# Patient Record
Sex: Male | Born: 1990 | Race: Asian | Hispanic: No | Marital: Single | Smoking: Never smoker
Health system: Southern US, Community
[De-identification: ages and names within clinical notes are randomized; demographics above are authoritative.]

## PROBLEM LIST (undated history)

## (undated) ENCOUNTER — Emergency Department (HOSPITAL_COMMUNITY): Admission: EM | Discharge status: Absent without leave | Payer: Self-pay

## (undated) DIAGNOSIS — Z9109 Other allergy status, other than to drugs and biological substances: Secondary | ICD-10-CM

---
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
## 2012-09-30 ENCOUNTER — Emergency Department (INDEPENDENT_AMBULATORY_CARE_PROVIDER_SITE_OTHER): Payer: Self-pay

## 2012-09-30 ENCOUNTER — Emergency Department (INDEPENDENT_AMBULATORY_CARE_PROVIDER_SITE_OTHER)
Admission: EM | Admit: 2012-09-30 | Discharge: 2012-09-30 | Disposition: A | Discharge status: Home / Self Care | Payer: Self-pay | Source: Home / Self Care | Attending: Emergency Medicine | Admitting: Emergency Medicine

## 2012-09-30 ENCOUNTER — Encounter (HOSPITAL_COMMUNITY): Payer: Self-pay | Admitting: *Deleted

## 2012-09-30 DIAGNOSIS — S139XXA Sprain of joints and ligaments of unspecified parts of neck, initial encounter: Secondary | ICD-10-CM

## 2012-09-30 DIAGNOSIS — R05 Cough: Secondary | ICD-10-CM

## 2012-09-30 DIAGNOSIS — R059 Cough, unspecified: Secondary | ICD-10-CM

## 2012-09-30 DIAGNOSIS — S161XXA Strain of muscle, fascia and tendon at neck level, initial encounter: Secondary | ICD-10-CM

## 2012-09-30 HISTORY — DX: Other allergy status, other than to drugs and biological substances: Z91.09

## 2012-09-30 MED ORDER — CYCLOBENZAPRINE HCL 10 MG PO TABS: 10.0000 mg | ORAL_TABLET | Freq: Three times a day (TID) | ORAL | Status: DC | PRN

## 2012-09-30 MED ORDER — HYDROCODONE-ACETAMINOPHEN 5-325 MG PO TABS: 1.0000 | ORAL_TABLET | ORAL | Status: DC | PRN
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
## 2012-10-02 NOTE — ED Provider Notes (Signed)
Medical screening examination/treatment/procedure(s) were performed by non-physician practitioner and as supervising physician I was immediately available for consultation/collaboration.  Diamone Whistler   Matisse Roskelley, MD 10/02/12 1156 

## 2014-09-06 IMAGING — CR DG CHEST 2V
2 series · 2 of 2 positions shown · non-contrast
Comparison: None.

CLINICAL DATA: 21-year-old male with productive cough.  History of
bronchitis.

CHEST - 2 VIEW

[view not recorded (1 of 2)]
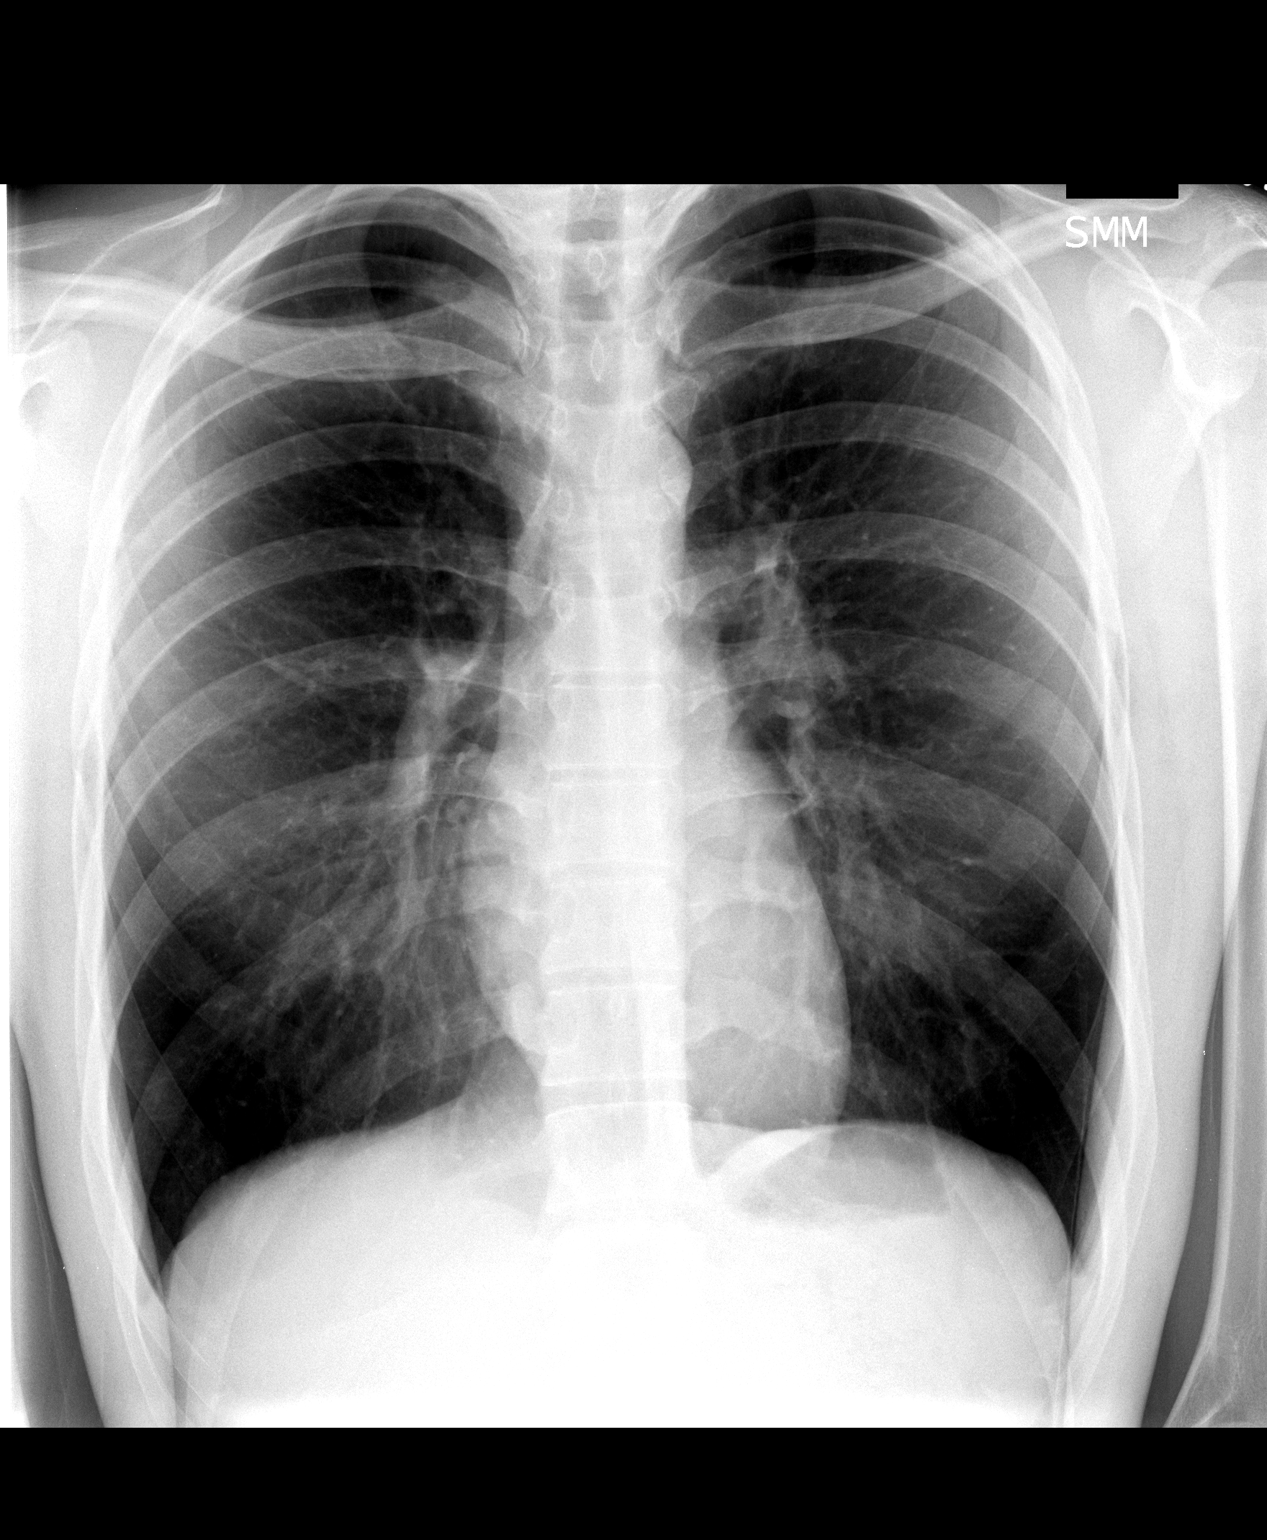

[view not recorded (2 of 2)]
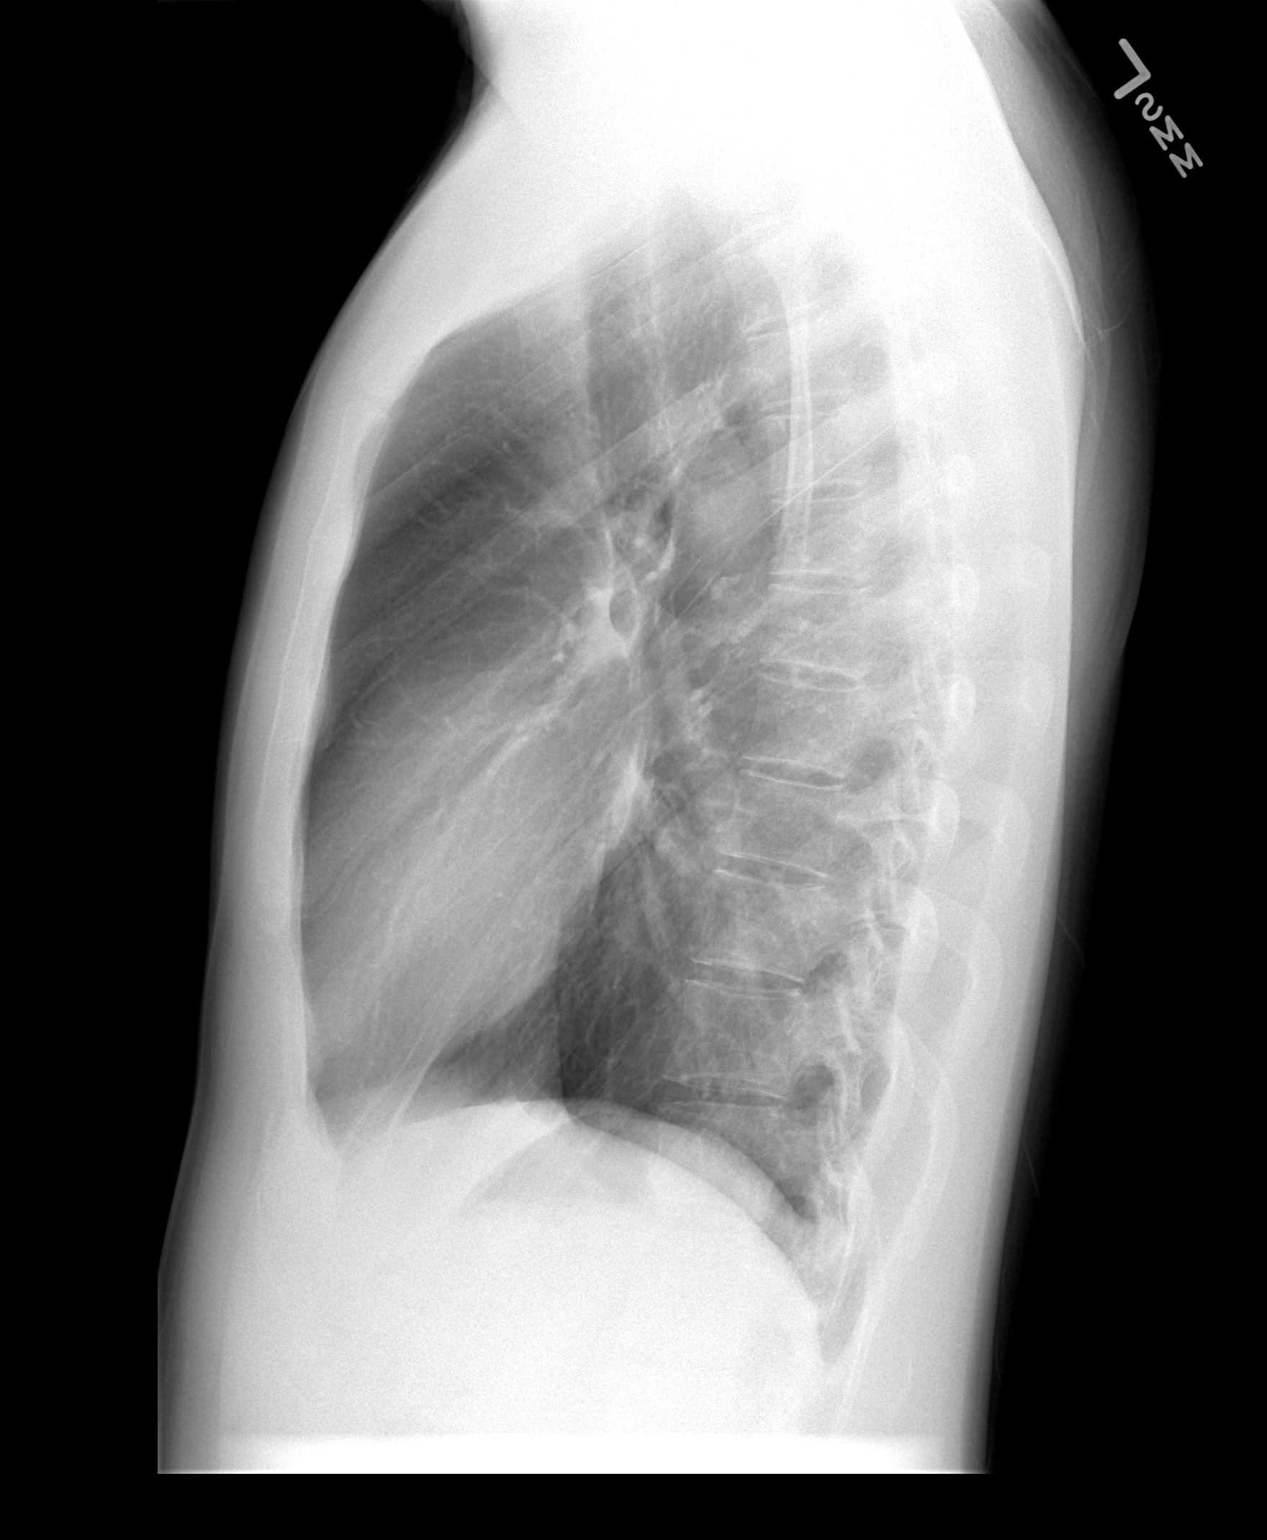

[2 of 2 positions shown; findings below may reference images not displayed]

FINDINGS: Normal lung volumes. Normal cardiac size and mediastinal
contours.  Negative tracheal air column.  The lungs are clear.  No
pneumothorax, pulmonary edema, pleural effusion or confluent
pulmonary opacity.  No pulmonary granuloma or scarring identified.
No osseous abnormality identified.
IMPRESSION: Negative, no acute cardiopulmonary abnormality.

## 2018-05-20 ENCOUNTER — Other Ambulatory Visit: Payer: Self-pay

## 2018-05-20 ENCOUNTER — Encounter (HOSPITAL_COMMUNITY): Payer: Self-pay | Admitting: Emergency Medicine

## 2018-05-20 ENCOUNTER — Emergency Department (HOSPITAL_COMMUNITY): Payer: Self-pay

## 2018-05-20 ENCOUNTER — Emergency Department (HOSPITAL_COMMUNITY)
Admission: EM | Admit: 2018-05-20 | Discharge: 2018-05-20 | Disposition: A | Discharge status: Home / Self Care | Payer: Self-pay | Attending: Emergency Medicine | Admitting: Emergency Medicine

## 2018-05-20 DIAGNOSIS — S80211A Abrasion, right knee, initial encounter: Secondary | ICD-10-CM | POA: Insufficient documentation

## 2018-05-20 DIAGNOSIS — Y9289 Other specified places as the place of occurrence of the external cause: Secondary | ICD-10-CM | POA: Insufficient documentation

## 2018-05-20 DIAGNOSIS — S0083XA Contusion of other part of head, initial encounter: Secondary | ICD-10-CM | POA: Insufficient documentation

## 2018-05-20 DIAGNOSIS — Y9389 Activity, other specified: Secondary | ICD-10-CM | POA: Insufficient documentation

## 2018-05-20 DIAGNOSIS — S0003XA Contusion of scalp, initial encounter: Secondary | ICD-10-CM

## 2018-05-20 DIAGNOSIS — Y999 Unspecified external cause status: Secondary | ICD-10-CM | POA: Insufficient documentation

## 2018-05-20 DIAGNOSIS — R51 Headache: Secondary | ICD-10-CM | POA: Insufficient documentation

## 2018-05-20 MED ORDER — ACETAMINOPHEN 500 MG PO TABS
1000.0000 mg | ORAL_TABLET | Freq: Once | ORAL | Status: AC
  Administered 2018-05-20: 1000 mg via ORAL
  Filled 2018-05-20: qty 2
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
## 2018-05-20 NOTE — ED Provider Notes (Signed)
Received sign out at shift change from PA [employer] Browning. CT head shows scalp contusion, negative for intracranial abnormality.  Patient will be discharged with instructions to follow-up with the concussion clinic.  I discussed NSAIDs for pain.  We talked about reducing screen time and no contact sports until cleared by physician.  Discussed return precautions and he agrees.   Patrick ShropshireShrosbree, Patrick Ferber J, PA-C 05/20/18 [id number]    Nicanor AlconPalumbo, April, MD 05/20/18 2326
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
## 2018-05-20 NOTE — Discharge Instructions (Addendum)
The scan of your head was reassuring.   Please follow up with the concussion clinic. Take motrin 600mg  every six hours as needed for headache. Avoid screen time like we discussed.   Return to the ER for any new or worsening symptoms like worsening headache with vomiting, trouble with your vision, numbness, weakness.

## 2018-05-20 NOTE — ED Notes (Signed)
Bed: WA15 Expected date:  Expected time:  Means of arrival:  Comments: 

## 2018-05-22 ENCOUNTER — Telehealth: Payer: Self-pay
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
## 2018-06-04 ENCOUNTER — Telehealth: Payer: Self-pay
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
## 2018-06-07 ENCOUNTER — Ambulatory Visit (INDEPENDENT_AMBULATORY_CARE_PROVIDER_SITE_OTHER): Payer: Self-pay | Admitting: Family Medicine

## 2018-06-07 ENCOUNTER — Encounter: Payer: Self-pay | Admitting: Family Medicine

## 2018-06-07 DIAGNOSIS — S060XAA Concussion with loss of consciousness status unknown, initial encounter: Secondary | ICD-10-CM | POA: Insufficient documentation

## 2018-06-07 DIAGNOSIS — S060X9A Concussion with loss of consciousness of unspecified duration, initial encounter: Secondary | ICD-10-CM | POA: Insufficient documentation

## 2018-06-07 DIAGNOSIS — S060X1A Concussion with loss of consciousness of 30 minutes or less, initial encounter: Secondary | ICD-10-CM

## 2018-06-07 NOTE — Patient Instructions (Signed)
Good to see you  I think you do have a concussion still  Fish oil 2 grams daily  Vitamin D 2000 IU daily  Choline 500mg  daily  New exercises can be helpful  See me again in 3 weeks if not perfect

## 2018-06-07 NOTE — Assessment & Plan Note (Signed)
Patient does still have what appears to be a concussion.  3 weeks out.  Vestibular neuro difficulty seems to be going on.  Discussed icing regimen and home exercises.  Discussed which activities to do which wants to avoid.  Given vestibular neuro training.  Discussed vitamin supplementations.  Patient will follow-up with me again in 3 weeks if not completely resolved.  Patient needs to continue to work financially.

## 2018-06-29 NOTE — Progress Notes (Deleted)
Subjective:   @Patrick Snow @  Chief Complaint: De NurseManh C Ivy, DOB: 04/01/1991, is a 27 y.o. male who presents for No chief complaint on file.   Injury date : *** Visit #: ***  Previous imagine.   History of Present Illness:    Concussion Self-Reported Symptom Score Symptoms rated on a scale 1-6, in last 24 hours  Headache: ***    Nausea: ***  Vomiting: ***  Balance Difficulty: ***   Dizziness: ***  Fatigue: ***  Trouble Falling Asleep: ***   Sleep More Than Usual: ***  Sleep Less Than Usual: ***  Daytime Drowsiness: ***  Photophobia: ***  Phonophobia: ***  Feeling anxious: ***  Confused: ***  Irritability: ***  Sadness: ***  Nervousness: ***  Feeling More Emotional: ***  Numbness or Tingling: ***  Feeling Slowed Down: ***  Feeling Mentally Foggy: ***  Difficulty Concentrating: ***  Difficulty Remembering: ***  Visual Problems: ***  Neck Pain: ***  Tinnitus: ***   Total Symptom Score: *** Previous Symptom Score: ***  Review of Systems: Pertinent items are noted in HPI.  Review of History: Past Medical History: @[facility] @  Past Surgical History:  has no past surgical history on file. Family History: family history is not on file. no family history of autoimmune Social History:  reports that he has never smoked. He has never used smokeless tobacco. He reports that he drinks alcohol. He reports that he does not use drugs. Current Medications: currently has no medications in their medication list. Allergies: has No Known Allergies.  Objective:    Physical Examination There were no vitals filed for this visit. General: No apparent distress alert and oriented x3 mood and affect normal, dressed appropriately.  HEENT: Pupils equal, extraocular movements intact  Respiratory: Patient's speak in full sentences and does not appear short of breath  Cardiovascular: No lower extremity edema, non tender, no erythema  Skin: Warm dry intact with no signs of infection or  rash on extremities or on axial skeleton.  Abdomen: Soft nontender  Neuro: Cranial nerves II through XII are intact, neurovascularly intact in all extremities with 2+ DTRs and 2+ pulses.  Lymph: No lymphadenopathy of posterior or anterior cervical chain or axillae bilaterally.  Gait normal with good balance and coordination.  MSK:  Non tender with full range of motion and good stability and symmetric strength and tone of shoulders, elbows, wrist,  knee and ankles bilaterally.  Psychiatric: Oriented X3, intact recent and remote memory, judgement and insight, normal mood and affect  Concussion testing performed today:  I spent *** minutes with patient discussing test and results including review of history and patient chart and  integration of patient data, interpretation of standardized test results and clinical data, clinical decision making, treatment planning and report,and interactive feedback to the patient with all of patients questions answered.    Neurocognitive testing (ImPACT):  Baseline:*** Post #1: ***   Verbal Memory Composite *** (***%) *** (***%)   Visual Memory Composite *** (***%) *** (***%)   Visual Motor Speed Composite *** (***%) *** (***%)   Reaction Time Composite *** (***%) *** (***%)   Cognitive Efficiency Index *** ***     Vestibular Screening:   Pre VOMS  HA Score: *** Pre VOMS  Dizziness Score: ***   Headache  Dizziness  Smooth Pursuits *** ***  H. Saccades *** ***  V. Saccades *** ***  H. VOR *** ***  V. VOR *** ***  Visual Motor Sensitivity *** ***  Accommodation Right: *** cm  Left: *** cm *** ***  Convergence: *** cm Divergence: *** cm *** ***   Balance Screen: ***  Additional testing performed today: { :28529}   Assessment:    No diagnosis found.  Kaeleb C Tsang presents with the following concussion subtypes. [] Cognitive [] Cervical [] Vestibular [] Ocular [] Migraine [] Anxiety/Mood   Plan:   Action/Discussion: Reviewed diagnosis,  management options, expected outcomes, and the reasons for scheduled and emergent follow-up. Questions were adequately answered. Patient expressed verbal understanding and agreement with the following plan.      Participation in school/work: Patient is cleared to return to work/school and activities of daily living without restrictions.  Patient is not cleared to return to work/school until further notice.  Patient may return to work/school on ***, with the following restrictions/supports:  Extra Time:  Take mental rest breaks during the day as needed. Check for return of symptoms when participating in any activities that require a significant amount of attention or concentration.  Allow extra time to complete tasks.  Please allow *** weeks to make up missed assignments, test, quizzes.  Visual/Vestibular Accommodations in School:  Allow patient to eat lunch in quiet environment with 1-2 classmates.  Allow patient to leave class 5 minutes before end of period to avoid busy/noisy hallway.  Please provide any supplemental learning materials (power points, lecture notes, handouts, etc) in minimum size 18 font and allow/provide any auditory supplements to learning when possible (books on tape, audio tape lectures, etc) to limit visual stress in the classroom.  Patient is cleared for auditory participation only. Patient is not cleared for homework, quizzes, or tests at this time.   Testing:  May begin taking tests/quizzes on *** with no more than one test/quiz per day.   No significant classroom or standardized testing until ***.  Home/Extracurricular:  Lessen work/homework load to allow adequate cognitive rest. Work *** minutes with intervals of *** minute breaks (total *** hours).  Limit visual stimulants including: driving, watching television/movies, reading, using cell phone, etc. - to ensure relative visual cognitive rest. NOT cleared for video or phone games. May participate ***  minutes with intervals of *** minute breaks (total *** hours).    Active Treatment Strategies:  Fueling your brain is important for recovery. It is essential to stay well hydrated, aiming for half of your body weight in fluid ounces per day (100 lbs = 50 oz). We also recommend eating breakfast to start your day and focus on a well-balanced diet containing lean protein, 'good' fats, and complex carbohydrates. See your nutrition / hydration handout for more details.   Quality sleep is vital in your concussion recovery. We encourage lots of sleep for the first 24-72 hours after injury but following this period it is important to regulate your sleep cycle. We encourage 8 hours of quality sleep per night. See your sleep handout for more details and strategies to quality sleep.  I  Treating your vestibular and visual dysfunction will decrease your recovery time and improve your symptoms. Begin your home vestibular exercise program as directed on your AVS.    Begin taking DHA supplement as directed.  .   Follow-up information:  Follow up appointment at [facility] .   Patient needs to arrive 30 minutes prior to appointment to complete the following tests: { :28378}.    Patient Education:  Reviewed with patient the risks (i.e, a repeat concussion, post-concussion syndrome, second-impact syndrome) of returning to play prior to complete resolution, and thoroughly reviewed the signs and symptoms of  concussion.Reviewed need for complete resolution of all symptoms, with rest AND exertion, prior to return to play.  Reviewed red flags for urgent medical evaluation: worsening symptoms, nausea/vomiting, intractable headache, musculoskeletal changes, focal neurological deficits.  [facility] Concussion Care Plan, which clearly outlines the plans stated above, was given to patient.  I was personally involved with the physical evaluation of and am in agreement with the assessment  and treatment plan for this patient.  Greater than 50% of this encounter was spent in direct consultation with the patient in evaluation, counseling, and coordination of care. Duration of encounter: { :28531} minutes.  After Visit Summary printed out and provided to patient as appropriate.

## 2018-07-02 ENCOUNTER — Ambulatory Visit: Payer: Self-pay | Admitting: Family Medicine

## 2018-07-18 NOTE — Progress Notes (Deleted)
Subjective:   @Patrick Snow @  Chief Complaint: De NurseManh C Ivy, DOB: 04/01/1991, is a 27 y.o. male who presents for No chief complaint on file.   Injury date : *** Visit #: ***  Previous imagine.   History of Present Illness:    Concussion Self-Reported Symptom Score Symptoms rated on a scale 1-6, in last 24 hours  Headache: ***    Nausea: ***  Vomiting: ***  Balance Difficulty: ***   Dizziness: ***  Fatigue: ***  Trouble Falling Asleep: ***   Sleep More Than Usual: ***  Sleep Less Than Usual: ***  Daytime Drowsiness: ***  Photophobia: ***  Phonophobia: ***  Feeling anxious: ***  Confused: ***  Irritability: ***  Sadness: ***  Nervousness: ***  Feeling More Emotional: ***  Numbness or Tingling: ***  Feeling Slowed Down: ***  Feeling Mentally Foggy: ***  Difficulty Concentrating: ***  Difficulty Remembering: ***  Visual Problems: ***  Neck Pain: ***  Tinnitus: ***   Total Symptom Score: *** Previous Symptom Score: ***  Review of Systems: Pertinent items are noted in HPI.  Review of History: Past Medical History: @[facility] @  Past Surgical History:  has no past surgical history on file. Family History: family history is not on file. no family history of autoimmune Social History:  reports that he has never smoked. He has never used smokeless tobacco. He reports that he drinks alcohol. He reports that he does not use drugs. Current Medications: currently has no medications in their medication list. Allergies: has No Known Allergies.  Objective:    Physical Examination There were no vitals filed for this visit. General: No apparent distress alert and oriented x3 mood and affect normal, dressed appropriately.  HEENT: Pupils equal, extraocular movements intact  Respiratory: Patient's speak in full sentences and does not appear short of breath  Cardiovascular: No lower extremity edema, non tender, no erythema  Skin: Warm dry intact with no signs of infection or  rash on extremities or on axial skeleton.  Abdomen: Soft nontender  Neuro: Cranial nerves II through XII are intact, neurovascularly intact in all extremities with 2+ DTRs and 2+ pulses.  Lymph: No lymphadenopathy of posterior or anterior cervical chain or axillae bilaterally.  Gait normal with good balance and coordination.  MSK:  Non tender with full range of motion and good stability and symmetric strength and tone of shoulders, elbows, wrist,  knee and ankles bilaterally.  Psychiatric: Oriented X3, intact recent and remote memory, judgement and insight, normal mood and affect  Concussion testing performed today:  I spent *** minutes with patient discussing test and results including review of history and patient chart and  integration of patient data, interpretation of standardized test results and clinical data, clinical decision making, treatment planning and report,and interactive feedback to the patient with all of patients questions answered.    Neurocognitive testing (ImPACT):  Baseline:*** Post #1: ***   Verbal Memory Composite *** (***%) *** (***%)   Visual Memory Composite *** (***%) *** (***%)   Visual Motor Speed Composite *** (***%) *** (***%)   Reaction Time Composite *** (***%) *** (***%)   Cognitive Efficiency Index *** ***     Vestibular Screening:   Pre VOMS  HA Score: *** Pre VOMS  Dizziness Score: ***   Headache  Dizziness  Smooth Pursuits *** ***  H. Saccades *** ***  V. Saccades *** ***  H. VOR *** ***  V. VOR *** ***  Visual Motor Sensitivity *** ***  Accommodation Right: *** cm  Left: *** cm *** ***  Convergence: *** cm Divergence: *** cm *** ***   Balance Screen: ***  Additional testing performed today: { :28529}   Assessment:    No diagnosis found.  Bennet C Ijames presents with the following concussion subtypes. [] Cognitive [] Cervical [] Vestibular [] Ocular [] Migraine [] Anxiety/Mood   Plan:   Action/Discussion: Reviewed diagnosis,  management options, expected outcomes, and the reasons for scheduled and emergent follow-up. Questions were adequately answered. Patient expressed verbal understanding and agreement with the following plan.      Participation in school/work: Patient is cleared to return to work/school and activities of daily living without restrictions.  Patient is not cleared to return to work/school until further notice.  Patient may return to work/school on ***, with the following restrictions/supports:  Extra Time:  Take mental rest breaks during the day as needed. Check for return of symptoms when participating in any activities that require a significant amount of attention or concentration.  Allow extra time to complete tasks.  Please allow *** weeks to make up missed assignments, test, quizzes.  Visual/Vestibular Accommodations in School:  Allow patient to eat lunch in quiet environment with 1-2 classmates.  Allow patient to leave class 5 minutes before end of period to avoid busy/noisy hallway.  Please provide any supplemental learning materials (power points, lecture notes, handouts, etc) in minimum size 18 font and allow/provide any auditory supplements to learning when possible (books on tape, audio tape lectures, etc) to limit visual stress in the classroom.  Patient is cleared for auditory participation only. Patient is not cleared for homework, quizzes, or tests at this time.   Testing:  May begin taking tests/quizzes on *** with no more than one test/quiz per day.   No significant classroom or standardized testing until ***.  Home/Extracurricular:  Lessen work/homework load to allow adequate cognitive rest. Work *** minutes with intervals of *** minute breaks (total *** hours).  Limit visual stimulants including: driving, watching television/movies, reading, using cell phone, etc. - to ensure relative visual cognitive rest. NOT cleared for video or phone games. May participate ***  minutes with intervals of *** minute breaks (total *** hours).    Active Treatment Strategies:  Fueling your brain is important for recovery. It is essential to stay well hydrated, aiming for half of your body weight in fluid ounces per day (100 lbs = 50 oz). We also recommend eating breakfast to start your day and focus on a well-balanced diet containing lean protein, 'good' fats, and complex carbohydrates. See your nutrition / hydration handout for more details.   Quality sleep is vital in your concussion recovery. We encourage lots of sleep for the first 24-72 hours after injury but following this period it is important to regulate your sleep cycle. We encourage 8 hours of quality sleep per night. See your sleep handout for more details and strategies to quality sleep.  I  Treating your vestibular and visual dysfunction will decrease your recovery time and improve your symptoms. Begin your home vestibular exercise program as directed on your AVS.    Begin taking DHA supplement as directed.  .   Follow-up information:  Follow up appointment at [facility] .   Patient needs to arrive 30 minutes prior to appointment to complete the following tests: { :28378}.    Patient Education:  Reviewed with patient the risks (i.e, a repeat concussion, post-concussion syndrome, second-impact syndrome) of returning to play prior to complete resolution, and thoroughly reviewed the signs and symptoms of  concussion.Reviewed need for complete resolution of all symptoms, with rest AND exertion, prior to return to play.  Reviewed red flags for urgent medical evaluation: worsening symptoms, nausea/vomiting, intractable headache, musculoskeletal changes, focal neurological deficits.  [facility] Concussion Care Plan, which clearly outlines the plans stated above, was given to patient.  I was personally involved with the physical evaluation of and am in agreement with the assessment  and treatment plan for this patient.  Greater than 50% of this encounter was spent in direct consultation with the patient in evaluation, counseling, and coordination of care. Duration of encounter: { :28531} minutes.  After Visit Summary printed out and provided to patient as appropriate.

## 2018-07-19 ENCOUNTER — Ambulatory Visit: Payer: Self-pay | Admitting: Family Medicine

## 2018-07-19 DIAGNOSIS — Z0289 Encounter for other administrative examinations: Secondary | ICD-10-CM
